# Patient Record
Sex: Female | Born: 1970 | Race: White | Hispanic: No | Smoking: Former smoker
Health system: Southern US, Community
[De-identification: ages and names within clinical notes are randomized; demographics above are authoritative.]

## PROBLEM LIST (undated history)

## (undated) DIAGNOSIS — N3281 Overactive bladder: Secondary | ICD-10-CM

## (undated) HISTORY — DX: Overactive bladder: N32.81

## (undated) HISTORY — PX: ABDOMINAL HYSTERECTOMY: SHX81

---

## 2000-02-06 ENCOUNTER — Encounter: Payer: Self-pay | Admitting: Gynecology

## 2000-02-06 ENCOUNTER — Inpatient Hospital Stay (HOSPITAL_COMMUNITY): Admission: AD | Admit: 2000-02-06 | Discharge: 2000-02-06 | Payer: Self-pay | Admitting: Gynecology

## 2000-02-08 ENCOUNTER — Ambulatory Visit (HOSPITAL_COMMUNITY): Admission: RE | Admit: 2000-02-08 | Discharge: 2000-02-08 | Payer: Self-pay | Admitting: Gynecology

## 2000-11-11 ENCOUNTER — Other Ambulatory Visit: Admission: RE | Admit: 2000-11-11 | Discharge: 2000-11-11 | Payer: Self-pay | Admitting: Gynecology

## 2001-05-14 ENCOUNTER — Inpatient Hospital Stay (HOSPITAL_COMMUNITY): Admission: AD | Admit: 2001-05-14 | Discharge: 2001-05-19 | Payer: Self-pay | Admitting: Gynecology

## 2001-07-07 ENCOUNTER — Other Ambulatory Visit: Admission: RE | Admit: 2001-07-07 | Discharge: 2001-07-07 | Payer: Self-pay | Admitting: Gynecology

## 2002-07-21 ENCOUNTER — Other Ambulatory Visit: Admission: RE | Admit: 2002-07-21 | Discharge: 2002-07-21 | Payer: Self-pay | Admitting: Gynecology

## 2003-11-01 ENCOUNTER — Other Ambulatory Visit: Admission: RE | Admit: 2003-11-01 | Discharge: 2003-11-01 | Payer: Self-pay | Admitting: Gynecology

## 2004-07-31 ENCOUNTER — Ambulatory Visit (HOSPITAL_BASED_OUTPATIENT_CLINIC_OR_DEPARTMENT_OTHER): Admission: RE | Admit: 2004-07-31 | Discharge: 2004-07-31 | Payer: Self-pay | Admitting: Gynecology

## 2004-07-31 ENCOUNTER — Observation Stay (HOSPITAL_COMMUNITY): Admission: AD | Admit: 2004-07-31 | Discharge: 2004-08-01 | Payer: Self-pay | Admitting: Gynecology

## 2004-08-02 ENCOUNTER — Observation Stay (HOSPITAL_COMMUNITY): Admission: AD | Admit: 2004-08-02 | Discharge: 2004-08-03 | Payer: Self-pay | Admitting: Gynecology

## 2004-08-09 ENCOUNTER — Ambulatory Visit: Payer: Self-pay | Admitting: Infectious Diseases

## 2004-08-09 ENCOUNTER — Inpatient Hospital Stay (HOSPITAL_COMMUNITY): Admission: AD | Admit: 2004-08-09 | Discharge: 2004-08-11 | Payer: Self-pay | Admitting: Gynecology

## 2004-08-21 ENCOUNTER — Ambulatory Visit (HOSPITAL_COMMUNITY): Admission: RE | Admit: 2004-08-21 | Discharge: 2004-08-21 | Payer: Self-pay | Admitting: Gynecology

## 2006-03-23 IMAGING — CT CT PELVIS W/ CM
1 of 3 series · 13 of 32 positions shown, 19 images · IV contrast ([ID] READICAT & [ID] OMNIP 300%)
Comparison: 08/09/04.

CLINICAL DATA: ABDOMEN CT WITH CONTRAST:
TECHNIQUE: Multidetector CT imaging of the abdomen was performed following the standard protocol during bolus administration of intravenous contrast.
 Contrast:  100 cc Omnipaque 300.
TECHNIQUE: Multidetector CT imaging of the pelvis was performed following the standard protocol during bolus administration of intravenous contrast.

[Series 2: abd pelvis · axial · 0.62mm/px · z∈[-481,-131]mm · 13 of 82 slices shown, 19 images]
[im 6/82  soft-tissue]
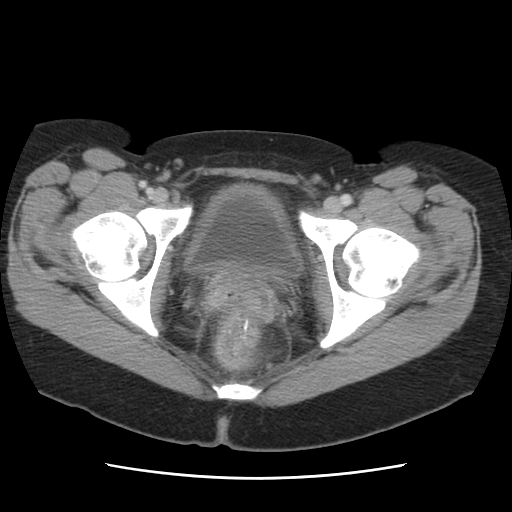
[im 6/82  bone]
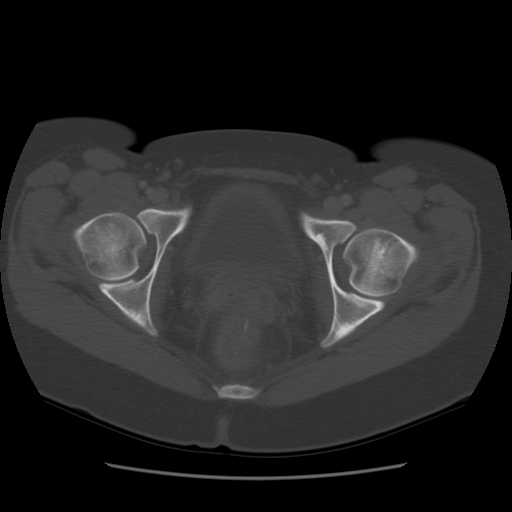
[im 11/82  soft-tissue]
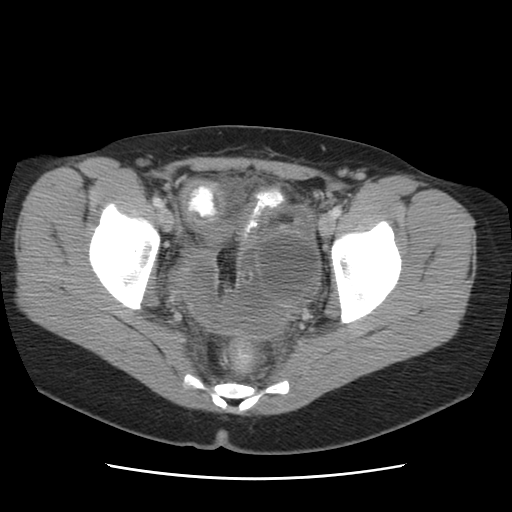
[im 17/82  soft-tissue]
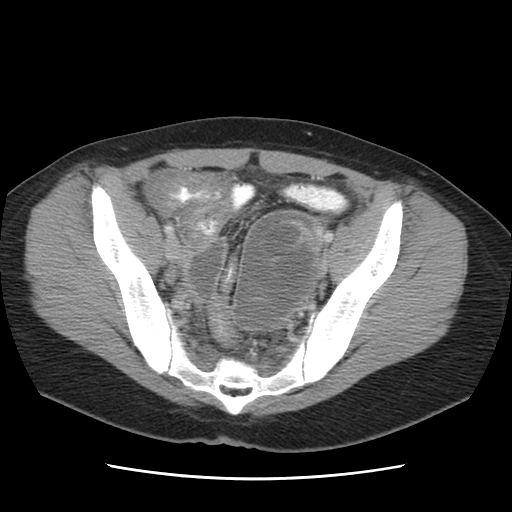
[im 22/82  soft-tissue]
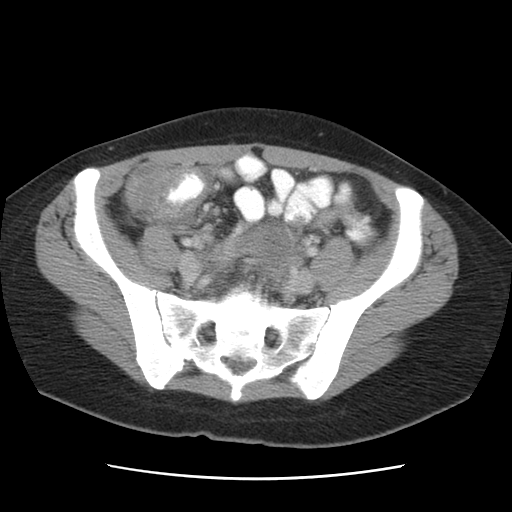
[im 28/82  soft-tissue]
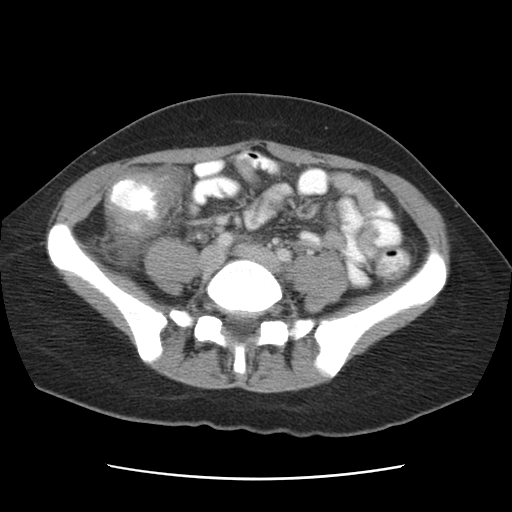
[im 33/82  soft-tissue]
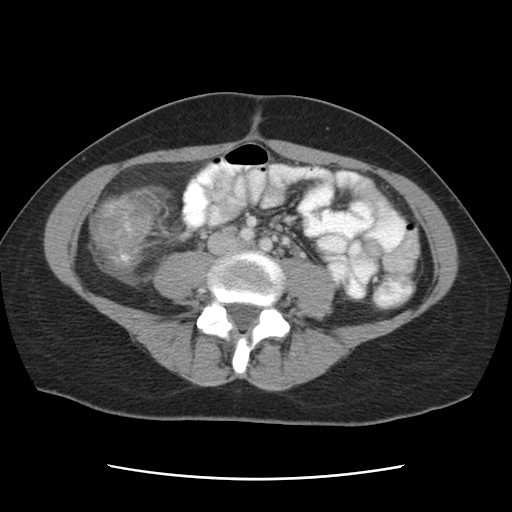
[im 44/82  soft-tissue]
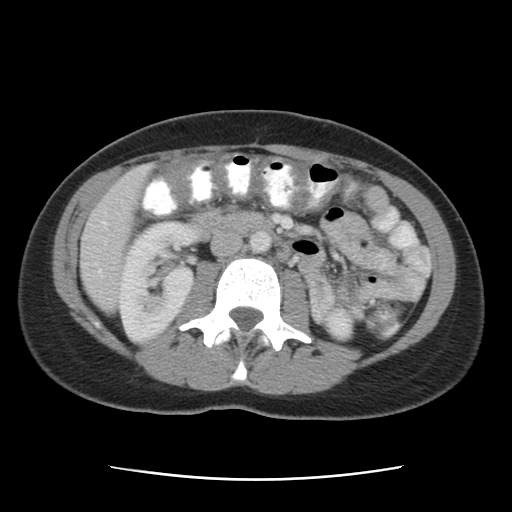
[im 49/82  soft-tissue]
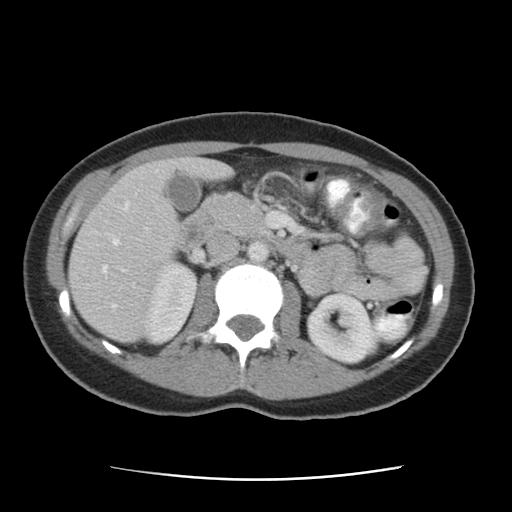
[im 55/82  soft-tissue]
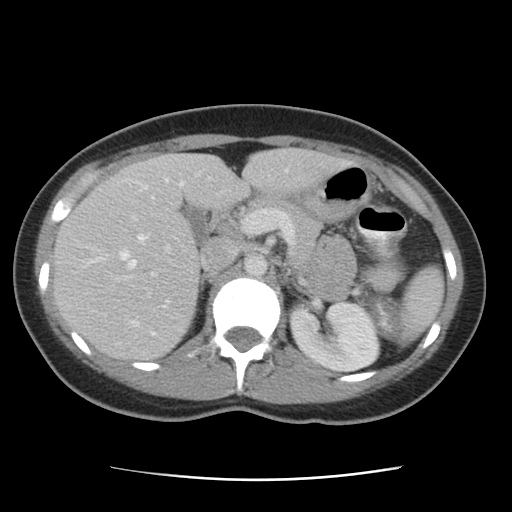
[im 55/82  bone]
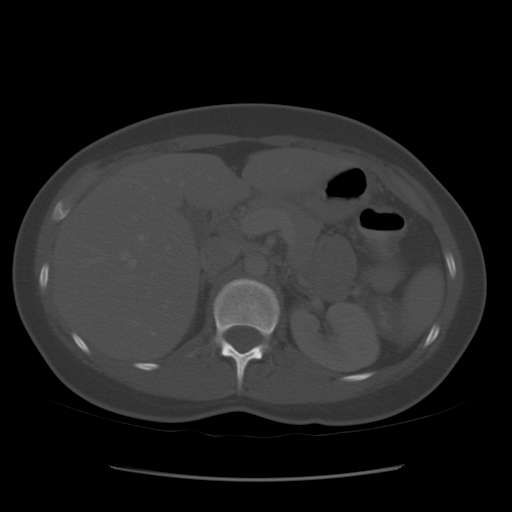
[im 60/82  soft-tissue]
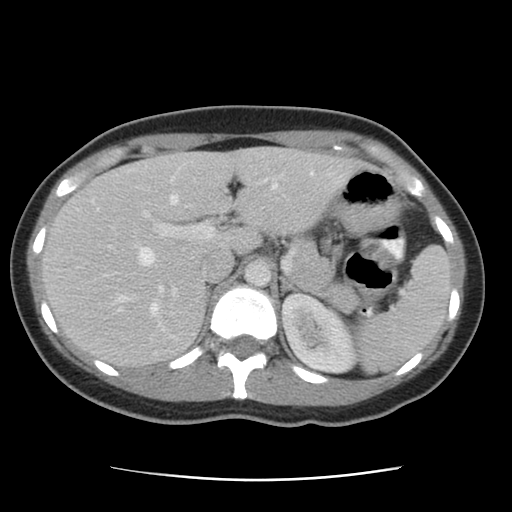
[im 60/82  lung]
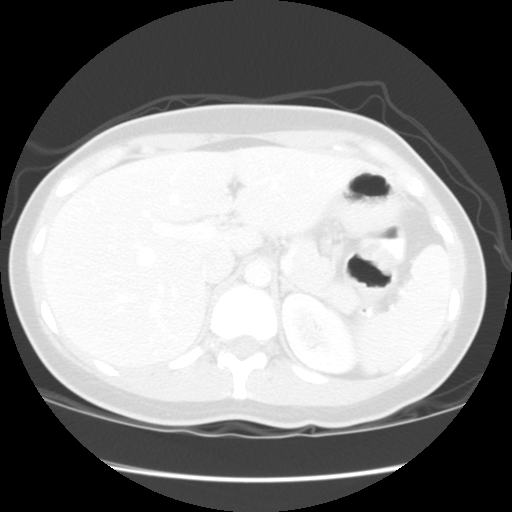
[im 65/82  soft-tissue]
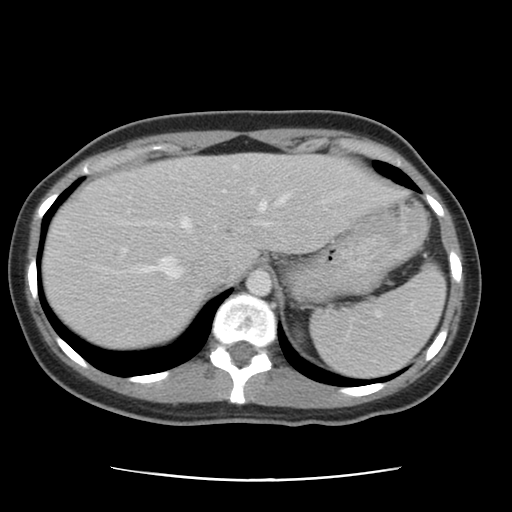
[im 65/82  lung]
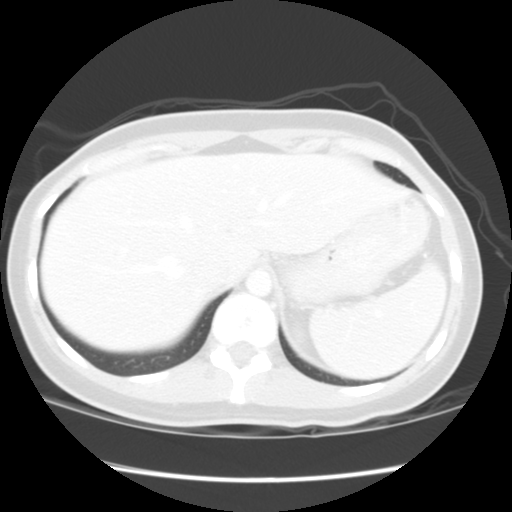
[im 71/82  soft-tissue]
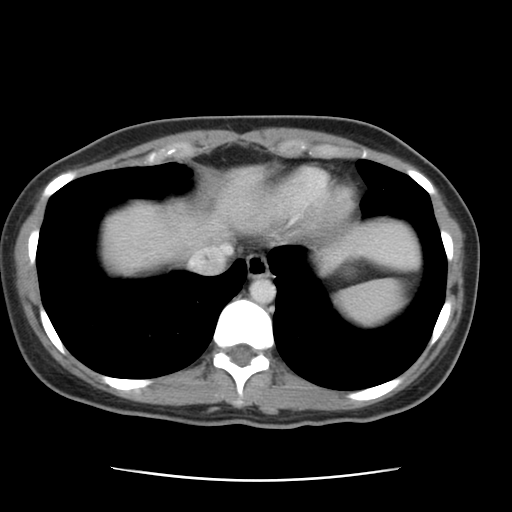
[im 71/82  lung]
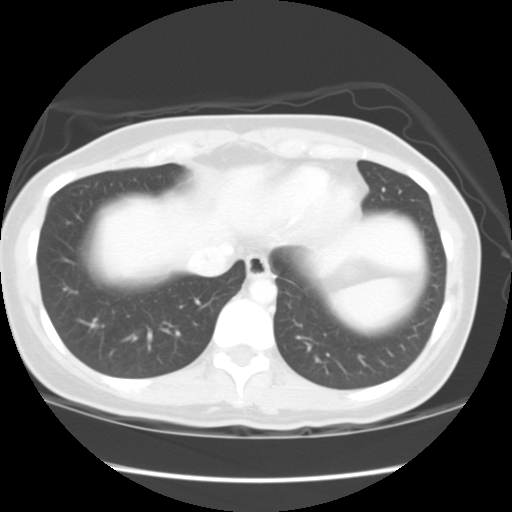
[im 76/82  soft-tissue]
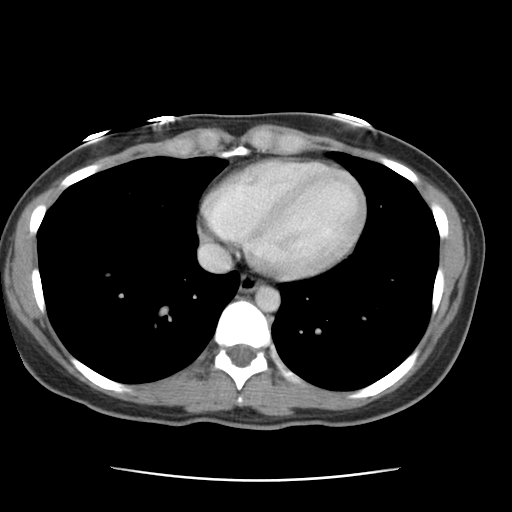
[im 76/82  lung]
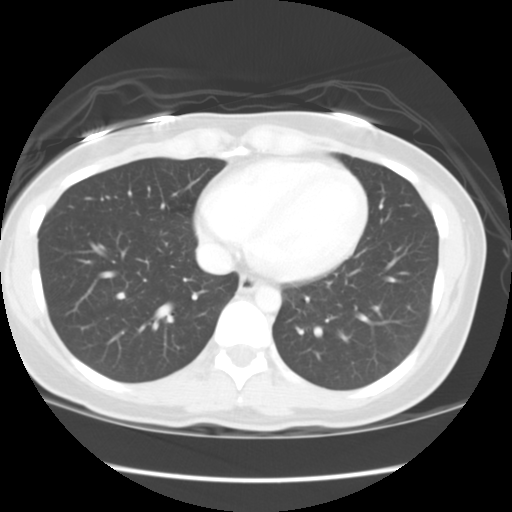

[13 of 32 positions shown; findings below may reference images not displayed]

FINDINGS: Lung bases are clear.  Liver has a normal appearance without focal lesions or biliary ductal dilatation.  No calcified gallstones.  The spleen is normal.  The pancreas is normal.  The adrenal glands and kidneys are normal.  The aorta and IVC are normal.  There is a thick-walled appearance of the ascending, transverse, and proximal descending colon consistent with colitis.  This appeared normal previously.  This could be post-antibiotic colitis.  No free fluid or air.
IMPRESSION: Development of colitis of the right colon, transverse colon, and proximal descending colon, possibly related to antibiotic therapy.
 PELVIS CT WITH CONTRAST:
FINDINGS: Complex pelvic fluid collection is considerably smaller and contains less hyperdense material.  Where it was previously measured at 10.8 x 4.9 cm, it measures 7.8 x 4.7 cm.  There continue to be some cystic areas in the left adnexa that could possibly relate to the ovary.  The patient has had a hysterectomy.
IMPRESSION: The pelvic fluid collection is smaller and contains less hyperdense material.  This could be a hematoma.  Whereas this may be a sterile collection, I cannot rule out the possibility of infection.  No progressive or worsening changes, however.

## 2011-09-28 ENCOUNTER — Ambulatory Visit (INDEPENDENT_AMBULATORY_CARE_PROVIDER_SITE_OTHER): Payer: BC Managed Care – PPO | Admitting: Physician Assistant

## 2011-09-28 VITALS — BP 141/96 | HR 101 | Temp 98.9°F | Resp 20 | Ht 62.0 in | Wt 162.0 lb

## 2011-09-28 DIAGNOSIS — R300 Dysuria: Secondary | ICD-10-CM

## 2011-09-28 DIAGNOSIS — N390 Urinary tract infection, site not specified: Secondary | ICD-10-CM

## 2011-09-28 DIAGNOSIS — R109 Unspecified abdominal pain: Secondary | ICD-10-CM

## 2011-09-28 DIAGNOSIS — R110 Nausea: Secondary | ICD-10-CM

## 2011-09-28 DIAGNOSIS — R350 Frequency of micturition: Secondary | ICD-10-CM

## 2011-09-28 LAB — POCT UA - MICROSCOPIC ONLY
Casts, Ur, LPF, POC: NEGATIVE
Crystals, Ur, HPF, POC: NEGATIVE
Mucus, UA: POSITIVE
Yeast, UA: NEGATIVE

## 2011-09-28 LAB — POCT URINALYSIS DIPSTICK
Bilirubin, UA: NEGATIVE
Blood, UA: NEGATIVE
Glucose, UA: NEGATIVE
Ketones, UA: NEGATIVE
Leukocytes, UA: NEGATIVE
Nitrite, UA: NEGATIVE
Protein, UA: NEGATIVE
Spec Grav, UA: >=1.03
Urobilinogen, UA: 0.2
pH, UA: 5.5

## 2011-09-28 MED ORDER — KETOROLAC TROMETHAMINE 60 MG/2ML IM SOLN
60.0000 mg | Freq: Once | INTRAMUSCULAR | Status: AC
  Administered 2011-09-28: 60 mg via INTRAMUSCULAR

## 2011-09-28 MED ORDER — LEVOFLOXACIN 500 MG PO TABS: 500.0000 mg | ORAL_TABLET | Freq: Every day | ORAL | Status: AC

## 2011-09-28 MED ORDER — CEFTRIAXONE SODIUM 1 G IJ SOLR
1.0000 g | Freq: Once | INTRAMUSCULAR | Status: AC
  Administered 2011-09-28: 1 g via INTRAMUSCULAR

## 2011-09-28 MED ORDER — ONDANSETRON 8 MG PO TBDP: 8.0000 mg | ORAL_TABLET | Freq: Three times a day (TID) | ORAL | Status: AC | PRN

## 2011-09-28 NOTE — Progress Notes (Signed)
  Subjective:    Patient ID: Joanne Hodge, female    DOB: Nov 26, 1970, 41 y.o.   MRN: [id number]  HPI 41 year old female presents with 1 week history of urinary frequency, urgency, and pressure. Does have some suprapubic pain and left sided flank pain.  She went to [facility] 1 week ago and was put on cipro x 5 days which helped but then 3 days ago her symptoms returned. She went back to see them again and she was told she had blood in her urine and her rx for cipro was extended for 3 more days. She has 1 pill left and still has severe urgency, pressure, and off and on flank pain. Denies dysuria, vaginal discharge, fever, chills, or vomiting. She has had some nausea.     Review of Systems  All other systems reviewed and are negative.       Objective:   Physical Exam  Constitutional: She is oriented to person, place, and time. She appears well-developed and well-nourished.  HENT:  Head: Normocephalic and atraumatic.  Right Ear: External ear normal.  Left Ear: External ear normal.  Mouth/Throat: No oropharyngeal exudate.  Eyes: Conjunctivae are normal.  Neck: Normal range of motion.  Cardiovascular: Normal rate, regular rhythm and normal heart sounds.   Pulmonary/Chest: Effort normal and breath sounds normal.  Abdominal: Soft. Bowel sounds are normal. There is tenderness (+suprapubic tenderness and slight left sided CVA tenderness. ). There is no rebound and no guarding.  Neurological: She is alert and oriented to person, place, and time.  Psychiatric: She has a normal mood and affect. Her behavior is normal. Judgment and thought content normal.          Assessment & Plan:   1. Spastic dysuria  POCT UA - Microscopic Only, POCT urinalysis dipstick  2. Flank pain  ketorolac (TORADOL) injection 60 mg  3. Urinary frequency  Urine culture  4. UTI (urinary tract infection)  cefTRIAXone (ROCEPHIN) injection 1 g  5. Nausea    I am going to cover for pyelonephritis due to patient's left  sided CVA tenderness.   Urine culture sent.  If symptoms worsen through the weekend, return for further evaluation or go to ER Take Zofran prn nausea. If no improvement by Monday, recommend follow up with Urology.

## 2011-10-01 LAB — URINE CULTURE
Colony Count: NO GROWTH
Organism ID, Bacteria: NO GROWTH

## 2012-01-30 ENCOUNTER — Ambulatory Visit (INDEPENDENT_AMBULATORY_CARE_PROVIDER_SITE_OTHER): Payer: BC Managed Care – PPO | Admitting: Family Medicine

## 2012-01-30 ENCOUNTER — Ambulatory Visit: Payer: BC Managed Care – PPO

## 2012-01-30 VITALS — BP 112/80 | HR 129 | Temp 98.8°F | Resp 18 | Ht 62.0 in | Wt 129.0 lb

## 2012-01-30 DIAGNOSIS — R05 Cough: Secondary | ICD-10-CM

## 2012-01-30 DIAGNOSIS — J209 Acute bronchitis, unspecified: Secondary | ICD-10-CM

## 2012-01-30 DIAGNOSIS — R0789 Other chest pain: Secondary | ICD-10-CM

## 2012-01-30 DIAGNOSIS — R000 Tachycardia, unspecified: Secondary | ICD-10-CM

## 2012-01-30 LAB — POCT CBC
Hemoglobin: 14.3 g/dL (ref 12.2–16.2)
MCH, POC: 29.2 pg (ref 27–31.2)
MCV: 93.4 fL (ref 80–97)
MPV: 8.9 fL (ref 0–99.8)
POC Granulocyte: 8.6 — AB (ref 2–6.9)
POC LYMPH PERCENT: 20.3 %L (ref 10–50)
POC MID %: 7.6 %M (ref 0–12)
RBC: 4.89 M/uL (ref 4.04–5.48)
RDW, POC: 12.8 %

## 2012-01-30 LAB — POCT INFLUENZA A/B
Influenza A, POC: NEGATIVE
Influenza B, POC: NEGATIVE

## 2012-01-30 MED ORDER — AZITHROMYCIN 250 MG PO TABS: ORAL_TABLET | ORAL | Status: DC

## 2012-01-30 MED ORDER — MUCINEX DM 30-600 MG PO TB12: 1.0000 | ORAL_TABLET | Freq: Two times a day (BID) | ORAL | Status: DC

## 2012-01-30 MED ORDER — BENZONATATE 100 MG PO CAPS: ORAL_CAPSULE | ORAL | Status: DC

## 2012-01-30 NOTE — Patient Instructions (Addendum)
1. Cough  POCT CBC, POCT Influenza A/B, TSH, Comprehensive metabolic panel, EKG 12-Lead, DG Chest 2 View  2. Tachycardia  POCT CBC, POCT Influenza A/B, TSH, Comprehensive metabolic panel, EKG 12-Lead, DG Chest 2 View  3. Chest tightness    4. Acute bronchitis

## 2012-01-30 NOTE — Progress Notes (Signed)
[address]   [place], [place]  [address]   [phone]  Subjective:    Patient ID: Joanne Hodge, female    DOB: 1970/02/12, 42 y.o.   MRN: [id number]  HPIThis 42 y.o. female presents for evaluation of acute cold symptoms.  Onset of chest congestion one day ago.  +fever Tmax 99.8; No Tylenol or Motrin.  +HA.  No ear pain.  +ST yesterday but better today.  +rhinorrhea.  +nasal congestion. +chest congestion; +alot of chest pressure.  No sputum production.  Mild coughing.  Mild SOB.  No n/v/d.  +myalgias.  Took Tessalon Perles for cough.  No flu vaccine.  No tobacco.  Does not feel rapid heart rate.  +Chest pressure is constant; nothing makes worse or better.  Taking appetite suppressant for weight loss but has not taken in past several days.  Has lost 33 pounds since last OV in 08/2011.     Review of Systems  Constitutional: Positive for fever, chills and fatigue. Negative for diaphoresis.  HENT: Positive for congestion, sore throat, rhinorrhea and postnasal drip. Negative for ear pain, trouble swallowing and sinus pressure.   Respiratory: Positive for cough, chest tightness and shortness of breath. Negative for wheezing and stridor.   Cardiovascular: Positive for chest pain. Negative for palpitations and leg swelling.  Gastrointestinal: Negative for nausea, vomiting and diarrhea.  Skin: Negative for rash.  Neurological: Positive for headaches. Negative for dizziness and light-headedness.    Past Medical History  Diagnosis Date  . Overactive bladder     Past Surgical History  Procedure Date  . Cesarean section   . Abdominal hysterectomy     Prior to Admission medications   Medication Sig Start Date End Date Taking? Authorizing Provider  darifenacin (ENABLEX) 15 MG 24 hr tablet Take 15 mg by mouth daily.   Yes Historical Provider, MD  ciprofloxacin (CIPRO) 500 MG tablet Take 500 mg by mouth 2 (two) times daily.    Historical Provider, MD  fluconazole (DIFLUCAN) 150 MG tablet  Take 150 mg by mouth once.    Historical Provider, MD    Allergies  Allergen Reactions  . Sulfa Antibiotics Hives    History   Social History  . Marital Status: Married    Spouse Name: N/A    Number of Children: N/A  . Years of Education: N/A   Occupational History  . Not on file.   Social History Main Topics  . Smoking status: Former Smoker    Quit date: 09/27/2008  . Smokeless tobacco: Not on file  . Alcohol Use: Not on file  . Drug Use: Not on file  . Sexually Active: Not on file   Other Topics Concern  . Not on file   Social History Narrative  . No narrative on file    Family History  Problem Relation Age of Onset  . Diabetes Mother   . Hypertension Mother   . COPD Father   . Heart disease Father        Objective:   Physical Exam  Nursing note and vitals reviewed. Constitutional: She is oriented to person, place, and time. She appears well-developed and well-nourished. No distress.  HENT:  Head: Normocephalic and atraumatic.  Right Ear: External ear normal.  Left Ear: External ear normal.  Nose: Nose normal.  Mouth/Throat: Oropharynx is clear and moist.  Eyes: Conjunctivae normal are normal. Pupils are equal, round, and reactive to light.  Neck: Normal range of motion. Neck supple. No thyromegaly present.  Cardiovascular: Regular rhythm and normal heart sounds.  Exam reveals no gallop and no friction rub.   No murmur heard.      Rate 104.  Pulmonary/Chest: Effort normal and breath sounds normal. No respiratory distress. She has no wheezes. She has no rales.  Lymphadenopathy:    She has cervical adenopathy.  Neurological: She is alert and oriented to person, place, and time.  Skin: Skin is warm and dry. No rash noted. She is not diaphoretic.  Psychiatric: She has a normal mood and affect. Her behavior is normal.    EKG:  NSR; rate 97; no acute changes.  Results for orders placed in visit on 01/30/12  POCT CBC      Component Value Range   WBC  11.9 (*) 4.6 - 10.2 K/uL   Lymph, poc 2.4  0.6 - 3.4   POC LYMPH PERCENT 20.3  10 - 50 %L   MID (cbc) 0.9  0 - 0.9   POC MID % 7.6  0 - 12 %M   POC Granulocyte 8.6 (*) 2 - 6.9   Granulocyte percent 72.1  37 - 80 %G   RBC 4.89  4.04 - 5.48 M/uL   Hemoglobin 14.3  12.2 - 16.2 g/dL   HCT, POC 16.1  09.6 - 47.9 %   MCV 93.4  80 - 97 fL   MCH, POC 29.2  27 - 31.2 pg   MCHC 31.3 (*) 31.8 - 35.4 g/dL   RDW, POC 04.5     Platelet Count, POC 312  142 - 424 K/uL   MPV 8.9  0 - 99.8 fL  POCT INFLUENZA A/B      Component Value Range   Influenza A, POC Negative     Influenza B, POC Negative      [facility] reading (PRIMARY) by  Dr. Katrinka Blazing.  CXR: NAD.     Assessment & Plan:   1. Cough  POCT CBC, POCT Influenza A/B, TSH, Comprehensive metabolic panel, EKG 12-Lead, DG Chest 2 View  2. Tachycardia  POCT CBC, POCT Influenza A/B, TSH, Comprehensive metabolic panel, EKG 12-Lead, DG Chest 2 View  3. Chest tightness    4. Acute bronchitis       1.  Acute bronchitis:  New.  Rx for Zpack, [employer], Mucinex DM.  Recommend supportive care with rest, fluids, Tylenol and/or Motrin. 2.  Chest tightness:  New. Associated with cough, tachycardia.  CXR negative; EKG normal. RTC if persists after treatment of bronchitis.  3.  Tachycardia:  New.  Improved with rest; EKG NSR.  Obtain TSH, CMET.  Intentional weight loss over past several months with assistance of appetite suppressant.    Meds ordered this encounter  Medications  . darifenacin (ENABLEX) 15 MG 24 hr tablet    Sig: Take 15 mg by mouth daily.  Marland Kitchen azithromycin (ZITHROMAX) 250 MG tablet    Sig: Take 2 tabs PO x 1 dose, then 1 tab PO QD x 4 days    Dispense:  6 tablet    Refill:  0  . Dextromethorphan-Guaifenesin (MUCINEX DM) 30-600 MG TB12    Sig: Take 1 tablet by mouth 2 (two) times daily.    Dispense:  28 each    Refill:  0  . benzonatate (TESSALON) 100 MG capsule    Sig: 1-2 CAPSULES TID PRN COUGH    Dispense:  60 capsule    Refill:  0

## 2012-01-31 LAB — COMPREHENSIVE METABOLIC PANEL
AST: 19 U/L (ref 0–37)
Albumin: 4.2 g/dL (ref 3.5–5.2)
Alkaline Phosphatase: 69 U/L (ref 39–117)
CO2: 27 mEq/L (ref 19–32)
Chloride: 101 mEq/L (ref 96–112)
Glucose, Bld: 82 mg/dL (ref 70–99)
Sodium: 136 mEq/L (ref 135–145)
Total Protein: 6.7 g/dL (ref 6.0–8.3)

## 2015-05-07 ENCOUNTER — Ambulatory Visit (INDEPENDENT_AMBULATORY_CARE_PROVIDER_SITE_OTHER): Payer: BLUE CROSS/BLUE SHIELD | Admitting: Physician Assistant

## 2015-05-07 VITALS — BP 116/62 | HR 88 | Temp 97.8°F | Resp 16 | Ht 61.25 in | Wt 152.6 lb

## 2015-05-07 DIAGNOSIS — K120 Recurrent oral aphthae: Secondary | ICD-10-CM

## 2015-05-07 DIAGNOSIS — J302 Other seasonal allergic rhinitis: Secondary | ICD-10-CM | POA: Insufficient documentation

## 2015-05-07 DIAGNOSIS — B349 Viral infection, unspecified: Secondary | ICD-10-CM | POA: Diagnosis not present

## 2015-05-07 DIAGNOSIS — R8290 Unspecified abnormal findings in urine: Secondary | ICD-10-CM | POA: Diagnosis not present

## 2015-05-07 LAB — POCT URINALYSIS DIP (MANUAL ENTRY)
Bilirubin, UA: NEGATIVE
Glucose, UA: NEGATIVE
Ketones, POC UA: NEGATIVE
NITRITE UA: NEGATIVE
PH UA: 5.5
Protein Ur, POC: NEGATIVE
Spec Grav, UA: 1.02
UROBILINOGEN UA: 0.2

## 2015-05-07 LAB — POC MICROSCOPIC URINALYSIS (UMFC)

## 2015-05-07 LAB — POCT CBC
GRANULOCYTE PERCENT: 56.8 % (ref 37–80)
HCT, POC: 38.9 % (ref 37.7–47.9)
HEMOGLOBIN: 13.8 g/dL (ref 12.2–16.2)
LYMPH, POC: 2.1 (ref 0.6–3.4)
MCH: 31.3 pg — AB (ref 27–31.2)
MCHC: 35.5 g/dL — AB (ref 31.8–35.4)
MCV: 88.2 fL (ref 80–97)
MID (cbc): 0.7 (ref 0–0.9)
MPV: 7.9 fL (ref 0–99.8)
POC GRANULOCYTE: 3.7 (ref 2–6.9)
POC LYMPH PERCENT: 33 %L (ref 10–50)
POC MID %: 10.2 % (ref 0–12)
Platelet Count, POC: 225 10*3/uL (ref 142–424)
RBC: 4.41 M/uL (ref 4.04–5.48)
RDW, POC: 12.6 %
WBC: 6.5 10*3/uL (ref 4.6–10.2)

## 2015-05-07 MED ORDER — MAGIC MOUTHWASH W/LIDOCAINE: 10.0000 mL | ORAL | Status: AC | PRN

## 2015-05-07 NOTE — Progress Notes (Signed)
Subjective:   Patient ID: Joanne Hodge, female     DOB: 07/05/1970, 45 y.o.    MRN: [id number]  PCP: Nonnie DoneSLATOSKY,JOHN J., MD  Chief Complaint  Patient presents with  . Mouth Lesions    has blisters in the roof of her mouth  . Sore Throat  . Headache    HPI  Presents for evaluation of sores on the roof of her mouth.  3 days of "a weird red stripe" on the roof of her mouth, "with white blisters on either side." Painful with eating. "Feels like strep would feel," except it's on the roof of the mouth. Intermittent pain with swallowing. Thought that it was irritated by something she ate initially.   Today feels bad in general. Tired. Headache. No fever, chills, nausea, vomiting or diarrhea. Has had some bilateral hip soreness for several days to a week, without identified cause, but no other myalgias or arthralgias.    Urine odor has changed-x 2 weeks. Looks darker than usual, sometimes looks cloudy. Increased urinary frequency. No dysuria. No vaginal discharge or itching. No change in diet, oral hydration or exercise.   Prior to Admission medications   Not on File     Allergies  Allergen Reactions  . Sulfa Antibiotics Hives     There are no active problems to display for this patient.    Family History  Problem Relation Age of Onset  . Diabetes Mother   . Hypertension Mother   . COPD Father   . Heart disease Father      Social History   Social History  . Marital Status: Significant Other    Spouse Name: fiance-Richard  . Number of Children: 1  . Years of Education: Associates   Occupational History  . legal secretary/paralegal    Social History Main Topics  . Smoking status: Former Smoker    Quit date: 09/27/2008  . Smokeless tobacco: Current User     Comment: vapes  . Alcohol Use: No  . Drug Use: No  . Sexual Activity:    Partners: Male    Birth Control/ Protection: Surgical   Other Topics Concern  . Not on file   Social History Narrative     Lives with her fiance and her daughter. Her mother also lives with them.        Review of Systems As above.      Objective:  Physical Exam  Constitutional: She is oriented to person, place, and time. She appears well-developed and well-nourished. She is active and cooperative. No distress.  BP 116/62 mmHg  Pulse 88  Temp(Src) 97.8 F (36.6 C) (Oral)  Resp 16  Ht 5' 1.25" (1.556 m)  Wt 152 lb 9.6 oz (69.219 kg)  BMI 28.59 kg/m2  SpO2 98%  HENT:  Head: Normocephalic and atraumatic.  Right Ear: Hearing, tympanic membrane, external ear and ear canal normal.  Left Ear: Hearing, tympanic membrane, external ear and ear canal normal.  Nose: Nose normal. No mucosal edema or rhinorrhea.  Mouth/Throat: Mucous membranes are normal. Oral lesions present. Normal dentition. No uvula swelling. No oropharyngeal exudate, posterior oropharyngeal edema, posterior oropharyngeal erythema or tonsillar abscesses.    Eyes: Conjunctivae are normal. No scleral icterus.  Neck: Normal range of motion. Neck supple. No thyromegaly present.  Cardiovascular: Normal rate, regular rhythm and normal heart sounds.   Pulses:      Radial pulses are 2+ on the right side, and 2+ on the left side.  Pulmonary/Chest: Effort normal and  breath sounds normal.  Lymphadenopathy:       Head (right side): No tonsillar, no preauricular, no posterior auricular and no occipital adenopathy present.       Head (left side): No tonsillar, no preauricular, no posterior auricular and no occipital adenopathy present.    She has no cervical adenopathy.       Right: No supraclavicular adenopathy present.       Left: No supraclavicular adenopathy present.  Neurological: She is alert and oriented to person, place, and time. No sensory deficit.  Skin: Skin is warm, dry and intact. No rash noted. No cyanosis or erythema. Nails show no clubbing.  Psychiatric: She has a normal mood and affect. Her speech is normal and behavior is  normal.    Results for orders placed or performed in visit on 05/07/15  POCT urinalysis dipstick  Result Value Ref Range   Color, UA yellow yellow   Clarity, UA cloudy (A) clear   Glucose, UA negative negative   Bilirubin, UA negative negative   Ketones, POC UA negative negative   Spec Grav, UA 1.020    Blood, UA trace-lysed (A) negative   pH, UA 5.5    Protein Ur, POC negative negative   Urobilinogen, UA 0.2    Nitrite, UA Negative Negative   Leukocytes, UA Trace (A) Negative  POCT Microscopic Urinalysis ([facility])  Result Value Ref Range   WBC,UR,HPF,POC Many (A) None WBC/hpf   RBC,UR,HPF,POC None None RBC/hpf   Bacteria Many (A) None, Too numerous to count   Mucus Present (A) Absent   Epithelial Cells, UR Per Microscopy Few (A) None, Too numerous to count cells/hpf  POCT CBC  Result Value Ref Range   WBC 6.5 4.6 - 10.2 K/uL   Lymph, poc 2.1 0.6 - 3.4   POC LYMPH PERCENT 33.0 10 - 50 %L   MID (cbc) 0.7 0 - 0.9   POC MID % 10.2 0 - 12 %M   POC Granulocyte 3.7 2 - 6.9   Granulocyte percent 56.8 37 - 80 %G   RBC 4.41 4.04 - 5.48 M/uL   Hemoglobin 13.8 12.2 - 16.2 g/dL   HCT, POC 82.9 56.2 - 47.9 %   MCV 88.2 80 - 97 fL   MCH, POC 31.3 (A) 27 - 31.2 pg   MCHC 35.5 (A) 31.8 - 35.4 g/dL   RDW, POC 13.0 %   Platelet Count, POC 225 142 - 424 K/uL   MPV 7.9 0 - 99.8 fL            Assessment & Plan:  1. Oral aphthous ulcer 3. Viral syndrome Supportive care. Anticipatory guidance provided. RTC if symptoms worsen/persist. - POCT CBC - magic mouthwash w/lidocaine SOLN; Take 10 mLs by mouth every 2 (two) hours as needed for mouth pain.  Dispense: 360 mL; Refill: 0  2. Abnormal urine odor No evidence of infection today. Likely due to inadequate oral hydration. Await UCx. - POCT urinalysis dipstick - POCT Microscopic Urinalysis ([facility]) - Urine culture    Fernande Bras, PA-C Physician Assistant-Certified [facility]

## 2015-05-07 NOTE — Patient Instructions (Addendum)
  Use ibuprofen and/or acetaminophen as needed for pain. Get plenty of rest and drink at least 64 ounces of water each day (it doesn't all have to be water, but water is best).   IF you received an x-ray today, you will receive an invoice from [facility]. Please contact [facility] at [phone] with questions or concerns regarding your invoice.   IF you received labwork today, you will receive an invoice from [facility]. Please contact [facility] at [phone] with questions or concerns regarding your invoice.   Our billing staff will not be able to assist you with questions regarding bills from these companies.  You will be contacted with the lab results as soon as they are available. The fastest way to get your results is to activate your My Chart account. Instructions are located on the last page of this paperwork. If you have not heard from us regarding the results in 2 weeks, please contact this office.

## 2015-05-10 LAB — URINE CULTURE

## 2015-05-14 MED ORDER — NITROFURANTOIN MONOHYD MACRO 100 MG PO CAPS: 100.0000 mg | ORAL_CAPSULE | Freq: Two times a day (BID) | ORAL | Status: AC

## 2015-05-14 NOTE — Addendum Note (Signed)
Addended by: Fernande BrasJEFFERY, Evamaria Detore S on: 05/14/2015 01:13 PM   Modules accepted: Orders, SmartSet

## 2015-05-31 ENCOUNTER — Emergency Department (HOSPITAL_COMMUNITY): Payer: BLUE CROSS/BLUE SHIELD

## 2015-05-31 ENCOUNTER — Encounter (HOSPITAL_COMMUNITY): Payer: Self-pay | Admitting: Emergency Medicine

## 2015-05-31 ENCOUNTER — Emergency Department (HOSPITAL_COMMUNITY)
Admission: EM | Admit: 2015-05-31 | Discharge: 2015-06-01 | Disposition: A | Discharge status: Home / Self Care | Payer: BLUE CROSS/BLUE SHIELD | Attending: Emergency Medicine | Admitting: Emergency Medicine

## 2015-05-31 DIAGNOSIS — R5383 Other fatigue: Secondary | ICD-10-CM | POA: Diagnosis not present

## 2015-05-31 DIAGNOSIS — R0602 Shortness of breath: Secondary | ICD-10-CM | POA: Diagnosis not present

## 2015-05-31 DIAGNOSIS — M549 Dorsalgia, unspecified: Secondary | ICD-10-CM | POA: Insufficient documentation

## 2015-05-31 DIAGNOSIS — Z87448 Personal history of other diseases of urinary system: Secondary | ICD-10-CM | POA: Diagnosis not present

## 2015-05-31 DIAGNOSIS — J3489 Other specified disorders of nose and nasal sinuses: Secondary | ICD-10-CM | POA: Diagnosis not present

## 2015-05-31 DIAGNOSIS — Z79899 Other long term (current) drug therapy: Secondary | ICD-10-CM | POA: Diagnosis not present

## 2015-05-31 DIAGNOSIS — R509 Fever, unspecified: Secondary | ICD-10-CM | POA: Diagnosis not present

## 2015-05-31 DIAGNOSIS — R1031 Right lower quadrant pain: Secondary | ICD-10-CM | POA: Insufficient documentation

## 2015-05-31 DIAGNOSIS — R110 Nausea: Secondary | ICD-10-CM | POA: Diagnosis not present

## 2015-05-31 DIAGNOSIS — R000 Tachycardia, unspecified: Secondary | ICD-10-CM | POA: Insufficient documentation

## 2015-05-31 DIAGNOSIS — R61 Generalized hyperhidrosis: Secondary | ICD-10-CM | POA: Insufficient documentation

## 2015-05-31 DIAGNOSIS — Z87891 Personal history of nicotine dependence: Secondary | ICD-10-CM | POA: Diagnosis not present

## 2015-05-31 DIAGNOSIS — R0981 Nasal congestion: Secondary | ICD-10-CM | POA: Diagnosis not present

## 2015-05-31 DIAGNOSIS — Z3202 Encounter for pregnancy test, result negative: Secondary | ICD-10-CM | POA: Diagnosis not present

## 2015-05-31 LAB — CBC WITH DIFFERENTIAL/PLATELET
BASOS ABS: 0 10*3/uL (ref 0.0–0.1)
BASOS PCT: 0 %
EOS ABS: 0 10*3/uL (ref 0.0–0.7)
Eosinophils Relative: 1 %
HCT: 38.3 % (ref 36.0–46.0)
HEMOGLOBIN: 13.1 g/dL (ref 12.0–15.0)
LYMPHS ABS: 0.6 10*3/uL — AB (ref 0.7–4.0)
Lymphocytes Relative: 11 %
MCH: 30.7 pg (ref 26.0–34.0)
MCHC: 34.2 g/dL (ref 30.0–36.0)
MCV: 89.7 fL (ref 78.0–100.0)
Monocytes Absolute: 0.3 10*3/uL (ref 0.1–1.0)
Monocytes Relative: 5 %
NEUTROS PCT: 83 %
Neutro Abs: 4.7 10*3/uL (ref 1.7–7.7)
PLATELETS: 192 10*3/uL (ref 150–400)
RBC: 4.27 MIL/uL (ref 3.87–5.11)
RDW: 12.7 % (ref 11.5–15.5)
WBC: 5.6 10*3/uL (ref 4.0–10.5)

## 2015-05-31 LAB — COMPREHENSIVE METABOLIC PANEL
ALBUMIN: 3.9 g/dL (ref 3.5–5.0)
ALK PHOS: 68 U/L (ref 38–126)
ALT: 29 U/L (ref 14–54)
AST: 33 U/L (ref 15–41)
Anion gap: 10 (ref 5–15)
BUN: 12 mg/dL (ref 6–20)
CALCIUM: 8.4 mg/dL — AB (ref 8.9–10.3)
CHLORIDE: 105 mmol/L (ref 101–111)
CO2: 21 mmol/L — AB (ref 22–32)
CREATININE: 0.85 mg/dL (ref 0.44–1.00)
GFR calc Af Amer: >60 mL/min (ref >60)
GFR calc non Af Amer: >60 mL/min (ref >60)
GLUCOSE: 120 mg/dL — AB (ref 65–99)
Potassium: 3.7 mmol/L (ref 3.5–5.1)
SODIUM: 136 mmol/L (ref 135–145)
Total Bilirubin: 0.5 mg/dL (ref 0.3–1.2)
Total Protein: 6.9 g/dL (ref 6.5–8.1)

## 2015-05-31 LAB — LIPASE, BLOOD: Lipase: 29 U/L (ref 11–51)

## 2015-05-31 LAB — URINALYSIS, ROUTINE W REFLEX MICROSCOPIC
Bilirubin Urine: NEGATIVE
GLUCOSE, UA: NEGATIVE mg/dL
KETONES UR: NEGATIVE mg/dL
LEUKOCYTES UA: NEGATIVE
NITRITE: NEGATIVE
PH: 5.5 (ref 5.0–8.0)
Protein, ur: NEGATIVE mg/dL
Specific Gravity, Urine: 1.02 (ref 1.005–1.030)

## 2015-05-31 LAB — URINE MICROSCOPIC-ADD ON: WBC UA: NONE SEEN WBC/hpf (ref 0–5)

## 2015-05-31 LAB — I-STAT CG4 LACTIC ACID, ED: LACTIC ACID, VENOUS: 1.7 mmol/L (ref 0.5–2.0)

## 2015-05-31 LAB — POC URINE PREG, ED: Preg Test, Ur: NEGATIVE

## 2015-05-31 MED ORDER — ACETAMINOPHEN 325 MG PO TABS
650.0000 mg | ORAL_TABLET | Freq: Once | ORAL | Status: AC | PRN
  Administered 2015-05-31: 650 mg via ORAL
  Filled 2015-05-31: qty 2

## 2015-05-31 MED ORDER — SODIUM CHLORIDE 0.9 % IV BOLUS (SEPSIS)
1000.0000 mL | Freq: Once | INTRAVENOUS | Status: AC
  Administered 2015-05-31: 1000 mL via INTRAVENOUS

## 2015-05-31 MED ORDER — DIATRIZOATE MEGLUMINE & SODIUM 66-10 % PO SOLN
15.0000 mL | Freq: Once | ORAL | Status: AC
  Administered 2015-05-31: 15 mL via ORAL

## 2015-05-31 MED ORDER — IOPAMIDOL (ISOVUE-300) INJECTION 61%
100.0000 mL | Freq: Once | INTRAVENOUS | Status: AC | PRN
  Administered 2015-05-31: 100 mL via INTRAVENOUS

## 2015-05-31 NOTE — ED Notes (Addendum)
Pt states that she started having a fever today at approx. 3pm. States she does not feel sick but was treated for a UTI this past month. Back pain. Alert and oriented.

## 2015-05-31 NOTE — ED Provider Notes (Signed)
CSN: [id number]     Arrival date & time 05/31/15  1853 History   First MD Initiated Contact with Patient 05/31/15 2142     Chief Complaint  Patient presents with  . Fever     (Consider location/radiation/quality/duration/timing/severity/associated sxs/prior Treatment) HPI Patient presents with acute onset shaking chills and fever starting around 3 PM this afternoon. She had one episode of nausea. She was diagnosed with UTI early in April but was not given antibiotics until a week ago. She finished her last antibiotic this morning. She's had nasal congestion and sinus pressure and she takes Zyrtec for seasonal allergies. She has a frontal headache but no sore throat, ear pain, neck pain or stiffness. She denies any dysuria, hematuria, frequency or urgency. She has had right flank pain for the last few days. She's had no vaginal bleeding or discharge. Past Medical History  Diagnosis Date  . Overactive bladder    Past Surgical History  Procedure Laterality Date  . Cesarean section    . Abdominal hysterectomy     Family History  Problem Relation Age of Onset  . Diabetes Mother   . Hypertension Mother   . COPD Father   . Heart disease Father    Social History  Substance Use Topics  . Smoking status: Former Smoker    Quit date: 09/27/2008  . Smokeless tobacco: Current User     Comment: vapes  . Alcohol Use: No   OB History    No data available     Review of Systems  Constitutional: Positive for fever, chills and fatigue.  HENT: Positive for congestion and sinus pressure. Negative for ear discharge, ear pain and sore throat.   Respiratory: Positive for shortness of breath. Negative for cough and wheezing.   Cardiovascular: Negative for chest pain, palpitations and leg swelling.  Gastrointestinal: Positive for nausea and abdominal pain. Negative for vomiting, diarrhea and constipation.  Genitourinary: Positive for flank pain. Negative for dysuria, frequency, hematuria, vaginal  bleeding, vaginal discharge, difficulty urinating and pelvic pain.  Musculoskeletal: Positive for myalgias and back pain. Negative for arthralgias, neck pain and neck stiffness.  Skin: Negative for rash and wound.  Neurological: Negative for dizziness, weakness, light-headedness, numbness and headaches.  All other systems reviewed and are negative.     Allergies  Sulfa antibiotics  Home Medications   Prior to Admission medications   Medication Sig Start Date End Date Taking? Authorizing Provider  cetirizine (ZYRTEC) 10 MG tablet Take 10 mg by mouth daily.   Yes Historical Provider, MD  azithromycin (ZITHROMAX) 250 MG tablet Take 1 tablet (250 mg total) by mouth daily. Take first 2 tablets together, then 1 every day until finished. 06/01/15   Loren Raceravid Geanette Buonocore, MD  magic mouthwash w/lidocaine SOLN Take 10 mLs by mouth every 2 (two) hours as needed for mouth pain. Patient not taking: Reported on 05/31/2015 05/07/15   Chelle Jeffery, PA-C  mometasone (NASONEX) 50 MCG/ACT nasal spray Place 2 sprays into the nose daily. 06/01/15   Loren Raceravid Marsia Cino, MD  oxymetazoline (AFRIN NASAL SPRAY) 0.05 % nasal spray Place 1 spray into both nostrils 2 (two) times daily. 06/01/15   Loren Raceravid Kiersten Coss, MD   BP 119/79 mmHg  Pulse 102  Temp(Src) 98 F (36.7 C) (Oral)  Resp 19  SpO2 98% Physical Exam  Constitutional: She is oriented to person, place, and time. She appears well-developed and well-nourished. No distress.  HENT:  Head: Normocephalic and atraumatic.  Mouth/Throat: Oropharynx is clear and moist. No oropharyngeal exudate.  Bilateral nasal mucosal edema. Patient does have tenderness to percussion over the right frontal and the left maxillary sinus. Bilateral TMs are normal  Eyes: EOM are normal. Pupils are equal, round, and reactive to light.  Neck: Normal range of motion. Neck supple.  No meningismus. No cervical adenopathy.  Cardiovascular: Regular rhythm.  Exam reveals no gallop and no friction rub.    No murmur heard. Tachycardia  Pulmonary/Chest: Effort normal. No respiratory distress. She has no wheezes. She has rales (few scattered Rales in bilateral bases).  Abdominal: Soft. Bowel sounds are normal. She exhibits no distension and no mass. There is tenderness (patient has right lower quadrant tenderness with palpation. There is no rebound or guarding.). There is no rebound and no guarding.  Musculoskeletal: Normal range of motion. She exhibits tenderness. She exhibits no edema.  Right flank tenderness with percussion. No midline thoracic or lumbar tenderness. No lower extremity swelling, asymmetry or tenderness. Distal pulses are equal and intact.  Neurological: She is alert and oriented to person, place, and time.  Moves all extremities without deficit. Sensation is fully intact.  Skin: Skin is warm. No rash noted. She is diaphoretic. No erythema.  Psychiatric: She has a normal mood and affect. Her behavior is normal.  Nursing note and vitals reviewed.   ED Course  Procedures (including critical care time) Labs Review Labs Reviewed  URINALYSIS, ROUTINE W REFLEX MICROSCOPIC (NOT AT [facility]) - Abnormal; Notable for the following:    Hgb urine dipstick TRACE (*)    All other components within normal limits  URINE MICROSCOPIC-ADD ON - Abnormal; Notable for the following:    Squamous Epithelial / LPF 0-5 (*)    Bacteria, UA RARE (*)    All other components within normal limits  CBC WITH DIFFERENTIAL/PLATELET - Abnormal; Notable for the following:    Lymphs Abs 0.6 (*)    All other components within normal limits  COMPREHENSIVE METABOLIC PANEL - Abnormal; Notable for the following:    CO2 21 (*)    Glucose, Bld 120 (*)    Calcium 8.4 (*)    All other components within normal limits  LIPASE, BLOOD  POC URINE PREG, ED  I-STAT CG4 LACTIC ACID, ED    Imaging Review Dg Chest 2 View  05/31/2015  CLINICAL DATA:  Fever and shortness of breath starting today. EXAM: CHEST  2 VIEW  COMPARISON:  01/30/2012 FINDINGS: The heart size and mediastinal contours are within normal limits. Both lungs are clear. The visualized skeletal structures are unremarkable. IMPRESSION: No active cardiopulmonary disease. Electronically Signed   By: Burman Nieves M.D.   On: 05/31/2015 22:55   Ct Abdomen Pelvis W Contrast  05/31/2015  CLINICAL DATA:  Fever, onset at 15:00. EXAM: CT ABDOMEN AND PELVIS WITH CONTRAST TECHNIQUE: Multidetector CT imaging of the abdomen and pelvis was performed using the standard protocol following bolus administration of intravenous contrast. CONTRAST:  ISOVUE-300 IOPAMIDOL (ISOVUE-300) INJECTION 61% COMPARISON:  08/21/2004 FINDINGS: Lower chest:  No significant abnormality Hepatobiliary: There are normal appearances of the liver, gallbladder and bile ducts. Pancreas: Unremarkable Spleen: Normal Adrenals/Urinary Tract: The adrenals and kidneys are normal in appearance. There is no urinary calculus evident. There is no hydronephrosis or ureteral dilatation. Collecting systems and ureters appear unremarkable. Stomach/Bowel: There are normal appearances of the stomach, small bowel and colon. The appendix is normal. Vascular/Lymphatic: The abdominal aorta is normal in caliber. There is mild atherosclerotic calcification. There is no adenopathy in the abdomen or pelvis. Reproductive: Hysterectomy.  No  significant adnexal abnormality. Other: Mild stranding opacities the pelvis likely represent scarring from the inflammatory changes evident on older CTs. No acute inflammatory changes are evident. No bowel obstruction. No extraluminal air. No ascites. Musculoskeletal: No significant skeletal lesion. IMPRESSION: No acute findings are evident in the abdomen or pelvis. Electronically Signed   By: Ellery Plunk M.D.   On: 05/31/2015 23:55   I have personally reviewed and evaluated these images and lab results as part of my medical decision-making.   EKG Interpretation None       MDM   Final diagnoses:  Fever, unspecified fever cause  Sinus congestion    Fever and tachycardia improved after Tylenol. Well-appearing. Labs are unremarkable. CT and chest x-ray without any obvious source for infection. Suspect possible sinusitis versus viral flulike illness. Advised to return immediately for worsening symptoms or concerns.  Loren Racer, MD 06/01/15 [phone]

## 2015-05-31 NOTE — ED Notes (Signed)
MD at bedside. 

## 2015-06-01 MED ORDER — MOMETASONE FUROATE 50 MCG/ACT NA SUSP: 2.0000 | Freq: Every day | NASAL | Status: AC

## 2015-06-01 MED ORDER — AZITHROMYCIN 250 MG PO TABS: 250.0000 mg | ORAL_TABLET | Freq: Every day | ORAL | Status: AC

## 2015-06-01 MED ORDER — OXYMETAZOLINE HCL 0.05 % NA SOLN: 1.0000 | Freq: Two times a day (BID) | NASAL | Status: AC

## 2015-06-01 NOTE — Discharge Instructions (Signed)
Fever, Adult A fever is an increase in the body's temperature. It is usually defined as a temperature of 100F (38C) or higher. Brief mild or moderate fevers generally have no long-term effects, and they often do not require treatment. Moderate or high fevers may make you feel uncomfortable and can sometimes be a sign of a serious illness or disease. The sweating that may occur with repeated or prolonged fever may also cause dehydration. Fever is confirmed by taking a temperature with a thermometer. A measured temperature can vary with:  Age.  Time of day.  Location of the thermometer:  Mouth (oral).  Rectum (rectal).  Ear (tympanic).  Underarm (axillary).  Forehead (temporal). HOME CARE INSTRUCTIONS Pay attention to any changes in your symptoms. Take these actions to help with your condition:  Take over-the counter and prescription medicines only as told by your health care provider. Follow the dosing instructions carefully.  If you were prescribed an antibiotic medicine, take it as told by your health care provider. Do not stop taking the antibiotic even if you start to feel better.  Rest as needed.  Drink enough fluid to keep your urine clear or pale yellow. This helps to prevent dehydration.  Sponge yourself or bathe with room-temperature water to help reduce your body temperature as needed. Do not use ice water.  Do not overbundle yourself in blankets or heavy clothes. SEEK MEDICAL CARE IF:  You vomit.  You cannot eat or drink without vomiting.  You have diarrhea.  You have pain when you urinate.  Your symptoms do not improve with treatment.  You develop new symptoms.  You develop excessive weakness. SEEK IMMEDIATE MEDICAL CARE IF:  You have shortness of breath or have trouble breathing.  You are dizzy or you faint.  You are disoriented or confused.  You develop signs of dehydration, such as a dry mouth, decreased urination, or paleness.  You develop  severe pain in your abdomen.  You have persistent vomiting or diarrhea.  You develop a skin rash.  Your symptoms suddenly get worse.   This information is not intended to replace advice given to you by your health care provider. Make sure you discuss any questions you have with your health care provider.   Document Released: 07/11/2000 Document Revised: 10/06/2014 Document Reviewed: 03/11/2014 Elsevier Interactive Patient Education 2016 [employer] Sinusitis, Adult Sinusitis is redness, soreness, and inflammation of the paranasal sinuses. Paranasal sinuses are air pockets within the bones of your face. They are located beneath your eyes, in the middle of your forehead, and above your eyes. In healthy paranasal sinuses, mucus is able to drain out, and air is able to circulate through them by way of your nose. However, when your paranasal sinuses are inflamed, mucus and air can become trapped. This can allow bacteria and other germs to grow and cause infection. Sinusitis can develop quickly and last only a short time (acute) or continue over a long period (chronic). Sinusitis that lasts for more than 12 weeks is considered chronic. CAUSES Causes of sinusitis include:  Allergies.  Structural abnormalities, such as displacement of the cartilage that separates your nostrils (deviated septum), which can decrease the air flow through your nose and sinuses and affect sinus drainage.  Functional abnormalities, such as when the small hairs (cilia) that line your sinuses and help remove mucus do not work properly or are not present. SIGNS AND SYMPTOMS Symptoms of acute and chronic sinusitis are the same. The primary symptoms are pain and pressure  around the affected sinuses. Other symptoms include:  Upper toothache.  Earache.  Headache.  Bad breath.  Decreased sense of smell and taste.  A cough, which worsens when you are lying flat.  Fatigue.  Fever.  Thick drainage from your nose,  which often is green and may contain pus (purulent).  Swelling and warmth over the affected sinuses. DIAGNOSIS Your health care provider will perform a physical exam. During your exam, your health care provider may perform any of the following to help determine if you have acute sinusitis or chronic sinusitis:  Look in your nose for signs of abnormal growths in your nostrils (nasal polyps).  Tap over the affected sinus to check for signs of infection.  View the inside of your sinuses using an imaging device that has a light attached (endoscope). If your health care provider suspects that you have chronic sinusitis, one or more of the following tests may be recommended:  Allergy tests.  Nasal culture. A sample of mucus is taken from your nose, sent to a lab, and screened for bacteria.  Nasal cytology. A sample of mucus is taken from your nose and examined by your health care provider to determine if your sinusitis is related to an allergy. TREATMENT Most cases of acute sinusitis are related to a viral infection and will resolve on their own within 10 days. Sometimes, medicines are prescribed to help relieve symptoms of both acute and chronic sinusitis. These may include pain medicines, decongestants, nasal steroid sprays, or saline sprays. However, for sinusitis related to a bacterial infection, your health care provider will prescribe antibiotic medicines. These are medicines that will help kill the bacteria causing the infection. Rarely, sinusitis is caused by a fungal infection. In these cases, your health care provider will prescribe antifungal medicine. For some cases of chronic sinusitis, surgery is needed. Generally, these are cases in which sinusitis recurs more than 3 times per year, despite other treatments. HOME CARE INSTRUCTIONS  Drink plenty of water. Water helps thin the mucus so your sinuses can drain more easily.  Use a humidifier.  Inhale steam 3-4 times a day (for  example, sit in the bathroom with the shower running).  Apply a warm, moist washcloth to your face 3-4 times a day, or as directed by your health care provider.  Use saline nasal sprays to help moisten and clean your sinuses.  Take medicines only as directed by your health care provider.  If you were prescribed either an antibiotic or antifungal medicine, finish it all even if you start to feel better. SEEK IMMEDIATE MEDICAL CARE IF:  You have increasing pain or severe headaches.  You have nausea, vomiting, or drowsiness.  You have swelling around your face.  You have vision problems.  You have a stiff neck.  You have difficulty breathing.   This information is not intended to replace advice given to you by your health care provider. Make sure you discuss any questions you have with your health care provider.   Document Released: 01/15/2005 Document Revised: 02/05/2014 Document Reviewed: 01/30/2011 Elsevier Interactive Patient Education [employer]

## 2015-06-01 NOTE — ED Notes (Signed)
Patient d/c'd self care.  F/U and medications discussed.  Patient verbalized understanding. 

## 2021-10-29 ENCOUNTER — Other Ambulatory Visit: Payer: Self-pay | Admitting: Student

## 2021-10-29 ENCOUNTER — Ambulatory Visit
Admission: RE | Admit: 2021-10-29 | Discharge: 2021-10-29 | Disposition: A | Discharge status: Home / Self Care | Payer: Commercial Managed Care - HMO | Source: Ambulatory Visit | Attending: Student | Admitting: Student

## 2021-10-29 ENCOUNTER — Ambulatory Visit
Admission: RE | Admit: 2021-10-29 | Discharge: 2021-10-29 | Disposition: A | Discharge status: Home / Self Care | Payer: Commercial Managed Care - HMO | Source: Ambulatory Visit | Attending: Family Medicine | Admitting: Family Medicine

## 2021-10-29 DIAGNOSIS — R52 Pain, unspecified: Secondary | ICD-10-CM | POA: Insufficient documentation

## 2021-10-30 ENCOUNTER — Other Ambulatory Visit: Payer: Self-pay | Admitting: Student

## 2021-10-30 DIAGNOSIS — R2231 Localized swelling, mass and lump, right upper limb: Secondary | ICD-10-CM

## 2023-11-05 DIAGNOSIS — M7521 Bicipital tendinitis, right shoulder: Secondary | ICD-10-CM | POA: Diagnosis not present

## 2023-11-20 DIAGNOSIS — M19011 Primary osteoarthritis, right shoulder: Secondary | ICD-10-CM | POA: Diagnosis not present

## 2023-11-29 DIAGNOSIS — M7521 Bicipital tendinitis, right shoulder: Secondary | ICD-10-CM | POA: Diagnosis not present

## 2023-11-29 DIAGNOSIS — M7501 Adhesive capsulitis of right shoulder: Secondary | ICD-10-CM | POA: Diagnosis not present

## 2024-01-08 DIAGNOSIS — M25511 Pain in right shoulder: Secondary | ICD-10-CM | POA: Diagnosis not present
